# Patient Record
Sex: Female | Born: 1975 | Hispanic: No | Marital: Married
Health system: Southern US, Community
[De-identification: ages and names within clinical notes are randomized; demographics above are authoritative.]

## PROBLEM LIST (undated history)

## (undated) DIAGNOSIS — Z87442 Personal history of urinary calculi: Secondary | ICD-10-CM

## (undated) DIAGNOSIS — M1990 Unspecified osteoarthritis, unspecified site: Secondary | ICD-10-CM

## (undated) DIAGNOSIS — R7303 Prediabetes: Secondary | ICD-10-CM

## (undated) DIAGNOSIS — D649 Anemia, unspecified: Secondary | ICD-10-CM

## (undated) DIAGNOSIS — Z789 Other specified health status: Secondary | ICD-10-CM

## (undated) HISTORY — PX: NO PAST SURGERIES: SHX2092

---

## 2016-04-10 DIAGNOSIS — H5213 Myopia, bilateral: Secondary | ICD-10-CM | POA: Diagnosis not present

## 2016-08-02 DIAGNOSIS — Z23 Encounter for immunization: Secondary | ICD-10-CM | POA: Diagnosis not present

## 2016-08-30 DIAGNOSIS — Z1231 Encounter for screening mammogram for malignant neoplasm of breast: Secondary | ICD-10-CM | POA: Diagnosis not present

## 2016-08-30 DIAGNOSIS — Z6825 Body mass index (BMI) 25.0-25.9, adult: Secondary | ICD-10-CM | POA: Diagnosis not present

## 2016-08-30 DIAGNOSIS — Z1389 Encounter for screening for other disorder: Secondary | ICD-10-CM | POA: Diagnosis not present

## 2016-08-30 DIAGNOSIS — Z1151 Encounter for screening for human papillomavirus (HPV): Secondary | ICD-10-CM | POA: Diagnosis not present

## 2016-08-30 DIAGNOSIS — Z01419 Encounter for gynecological examination (general) (routine) without abnormal findings: Secondary | ICD-10-CM | POA: Diagnosis not present

## 2016-08-30 DIAGNOSIS — Z124 Encounter for screening for malignant neoplasm of cervix: Secondary | ICD-10-CM | POA: Diagnosis not present

## 2017-02-05 DIAGNOSIS — Z0000 Encounter for general adult medical examination without abnormal findings: Secondary | ICD-10-CM | POA: Diagnosis not present

## 2017-06-15 DIAGNOSIS — Z23 Encounter for immunization: Secondary | ICD-10-CM | POA: Diagnosis not present

## 2017-08-09 DIAGNOSIS — H5213 Myopia, bilateral: Secondary | ICD-10-CM | POA: Diagnosis not present

## 2017-10-02 DIAGNOSIS — B079 Viral wart, unspecified: Secondary | ICD-10-CM | POA: Diagnosis not present

## 2018-01-29 DIAGNOSIS — N393 Stress incontinence (female) (male): Secondary | ICD-10-CM | POA: Diagnosis not present

## 2018-01-29 DIAGNOSIS — Z0000 Encounter for general adult medical examination without abnormal findings: Secondary | ICD-10-CM | POA: Diagnosis not present

## 2018-01-29 DIAGNOSIS — Z6825 Body mass index (BMI) 25.0-25.9, adult: Secondary | ICD-10-CM | POA: Diagnosis not present

## 2018-01-29 DIAGNOSIS — R1312 Dysphagia, oropharyngeal phase: Secondary | ICD-10-CM | POA: Diagnosis not present

## 2018-01-29 DIAGNOSIS — Z1389 Encounter for screening for other disorder: Secondary | ICD-10-CM | POA: Diagnosis not present

## 2018-01-29 DIAGNOSIS — Z01419 Encounter for gynecological examination (general) (routine) without abnormal findings: Secondary | ICD-10-CM | POA: Diagnosis not present

## 2018-01-29 DIAGNOSIS — Z124 Encounter for screening for malignant neoplasm of cervix: Secondary | ICD-10-CM | POA: Diagnosis not present

## 2018-01-29 DIAGNOSIS — Z1231 Encounter for screening mammogram for malignant neoplasm of breast: Secondary | ICD-10-CM | POA: Diagnosis not present

## 2018-01-29 DIAGNOSIS — Z23 Encounter for immunization: Secondary | ICD-10-CM | POA: Diagnosis not present

## 2018-01-29 DIAGNOSIS — E611 Iron deficiency: Secondary | ICD-10-CM | POA: Diagnosis not present

## 2018-01-30 ENCOUNTER — Other Ambulatory Visit: Payer: Self-pay | Admitting: Family Medicine

## 2018-01-30 DIAGNOSIS — R1312 Dysphagia, oropharyngeal phase: Secondary | ICD-10-CM

## 2018-01-31 ENCOUNTER — Ambulatory Visit
Admission: RE | Admit: 2018-01-31 | Discharge: 2018-01-31 | Disposition: A | Discharge status: Home / Self Care | Payer: 59 | Source: Ambulatory Visit | Attending: Family Medicine | Admitting: Family Medicine

## 2018-01-31 DIAGNOSIS — R1310 Dysphagia, unspecified: Secondary | ICD-10-CM | POA: Diagnosis not present

## 2018-01-31 DIAGNOSIS — R1312 Dysphagia, oropharyngeal phase: Secondary | ICD-10-CM

## 2018-04-17 DIAGNOSIS — R05 Cough: Secondary | ICD-10-CM | POA: Diagnosis not present

## 2018-05-10 DIAGNOSIS — Z23 Encounter for immunization: Secondary | ICD-10-CM | POA: Diagnosis not present

## 2018-09-24 DIAGNOSIS — H5213 Myopia, bilateral: Secondary | ICD-10-CM | POA: Diagnosis not present

## 2019-04-29 DIAGNOSIS — Z13220 Encounter for screening for lipoid disorders: Secondary | ICD-10-CM | POA: Diagnosis not present

## 2019-04-29 DIAGNOSIS — Z0000 Encounter for general adult medical examination without abnormal findings: Secondary | ICD-10-CM | POA: Diagnosis not present

## 2019-04-29 DIAGNOSIS — E611 Iron deficiency: Secondary | ICD-10-CM | POA: Diagnosis not present

## 2019-04-29 DIAGNOSIS — Z131 Encounter for screening for diabetes mellitus: Secondary | ICD-10-CM | POA: Diagnosis not present

## 2019-04-30 DIAGNOSIS — Z1389 Encounter for screening for other disorder: Secondary | ICD-10-CM | POA: Diagnosis not present

## 2019-04-30 DIAGNOSIS — Z130 Encounter for screening for diseases of the blood and blood-forming organs and certain disorders involving the immune mechanism: Secondary | ICD-10-CM | POA: Diagnosis not present

## 2019-04-30 DIAGNOSIS — Z01419 Encounter for gynecological examination (general) (routine) without abnormal findings: Secondary | ICD-10-CM | POA: Diagnosis not present

## 2019-04-30 DIAGNOSIS — Z6826 Body mass index (BMI) 26.0-26.9, adult: Secondary | ICD-10-CM | POA: Diagnosis not present

## 2019-05-21 DIAGNOSIS — Z23 Encounter for immunization: Secondary | ICD-10-CM | POA: Diagnosis not present

## 2019-06-06 DIAGNOSIS — M545 Low back pain: Secondary | ICD-10-CM | POA: Diagnosis not present

## 2019-06-06 DIAGNOSIS — Z0000 Encounter for general adult medical examination without abnormal findings: Secondary | ICD-10-CM | POA: Diagnosis not present

## 2019-11-07 ENCOUNTER — Ambulatory Visit: Payer: 59 | Attending: Internal Medicine

## 2019-11-07 DIAGNOSIS — Z23 Encounter for immunization: Secondary | ICD-10-CM | POA: Insufficient documentation

## 2019-11-07 NOTE — Progress Notes (Signed)
   Covid-19 Vaccination Clinic  Name:  Alexandra Vargas    MRN: [id number] DOB: [birth year]  11/07/2019  Ms. Rod was observed post Covid-19 immunization for 15 minutes without incident. She was provided with Vaccine Information Sheet and instruction to access the V-Safe system.   [name] was instructed to call 911 with any severe reactions post vaccine: Marland Kitchen Difficulty breathing  . Swelling of face and throat  . A fast heartbeat  . A bad rash all over body  . Dizziness and weakness   Immunizations Administered    Name Date Dose VIS Date Route   Pfizer COVID-19 Vaccine 11/07/2019 10:00 AM 0.3 mL 08/15/2019 Intramuscular   Manufacturer: [employer], [employer]   Lot: [id number]   NDC: [address]-7

## 2019-12-03 ENCOUNTER — Ambulatory Visit: Payer: 59 | Attending: Internal Medicine

## 2019-12-03 DIAGNOSIS — Z23 Encounter for immunization: Secondary | ICD-10-CM

## 2019-12-03 NOTE — Progress Notes (Signed)
   Covid-19 Vaccination Clinic  Name:  Alexandra Vargas    MRN: [id number] DOB: 24-Jan-1976  12/03/2019  Ms. Alexandra Vargas was observed post Covid-19 immunization for 15 minutes without incident. She was provided with Vaccine Information Sheet and instruction to access the V-Safe system.   Ms. Alexandra Vargas was instructed to call 911 with any severe reactions post vaccine: Marland Kitchen Difficulty breathing  . Swelling of face and throat  . A fast heartbeat  . A bad rash all over body  . Dizziness and weakness   Immunizations Administered    Name Date Dose VIS Date Route   Pfizer COVID-19 Vaccine 12/03/2019 10:32 AM 0.3 mL 08/15/2019 Intramuscular   Manufacturer: [employer], [employer]   Lot: [id number]   NDC: [address]-3

## 2020-05-30 DIAGNOSIS — Z20822 Contact with and (suspected) exposure to covid-19: Secondary | ICD-10-CM | POA: Diagnosis not present

## 2020-05-30 DIAGNOSIS — U071 COVID-19: Secondary | ICD-10-CM | POA: Diagnosis not present

## 2020-07-02 DIAGNOSIS — Z0000 Encounter for general adult medical examination without abnormal findings: Secondary | ICD-10-CM | POA: Diagnosis not present

## 2020-07-02 DIAGNOSIS — Z131 Encounter for screening for diabetes mellitus: Secondary | ICD-10-CM | POA: Diagnosis not present

## 2020-07-02 DIAGNOSIS — Z13220 Encounter for screening for lipoid disorders: Secondary | ICD-10-CM | POA: Diagnosis not present

## 2020-08-13 DIAGNOSIS — Z6825 Body mass index (BMI) 25.0-25.9, adult: Secondary | ICD-10-CM | POA: Diagnosis not present

## 2020-08-13 DIAGNOSIS — Z1231 Encounter for screening mammogram for malignant neoplasm of breast: Secondary | ICD-10-CM | POA: Diagnosis not present

## 2020-08-13 DIAGNOSIS — Z780 Asymptomatic menopausal state: Secondary | ICD-10-CM | POA: Diagnosis not present

## 2020-08-13 DIAGNOSIS — Z1389 Encounter for screening for other disorder: Secondary | ICD-10-CM | POA: Diagnosis not present

## 2020-08-13 DIAGNOSIS — Z01419 Encounter for gynecological examination (general) (routine) without abnormal findings: Secondary | ICD-10-CM | POA: Diagnosis not present

## 2020-08-16 DIAGNOSIS — Z1211 Encounter for screening for malignant neoplasm of colon: Secondary | ICD-10-CM | POA: Diagnosis not present

## 2020-08-16 DIAGNOSIS — E611 Iron deficiency: Secondary | ICD-10-CM | POA: Diagnosis not present

## 2020-11-11 DIAGNOSIS — Z01812 Encounter for preprocedural laboratory examination: Secondary | ICD-10-CM | POA: Diagnosis not present

## 2020-11-16 DIAGNOSIS — Z1211 Encounter for screening for malignant neoplasm of colon: Secondary | ICD-10-CM | POA: Diagnosis not present

## 2021-04-09 DIAGNOSIS — H5213 Myopia, bilateral: Secondary | ICD-10-CM | POA: Diagnosis not present

## 2021-07-06 DIAGNOSIS — Z23 Encounter for immunization: Secondary | ICD-10-CM | POA: Diagnosis not present

## 2021-07-06 DIAGNOSIS — Z0000 Encounter for general adult medical examination without abnormal findings: Secondary | ICD-10-CM | POA: Diagnosis not present

## 2021-07-06 DIAGNOSIS — Z131 Encounter for screening for diabetes mellitus: Secondary | ICD-10-CM | POA: Diagnosis not present

## 2021-07-06 DIAGNOSIS — D509 Iron deficiency anemia, unspecified: Secondary | ICD-10-CM | POA: Diagnosis not present

## 2021-07-06 DIAGNOSIS — Z13220 Encounter for screening for lipoid disorders: Secondary | ICD-10-CM | POA: Diagnosis not present

## 2021-07-11 ENCOUNTER — Other Ambulatory Visit (HOSPITAL_COMMUNITY): Payer: Self-pay

## 2021-07-11 MED ORDER — FERROUS SULFATE 325 (65 FE) MG PO TABS
325.0000 mg | ORAL_TABLET | Freq: Every day | ORAL | 6 refills | Status: DC
  Filled 2021-07-11: qty 30, 30d supply, fill #0

## 2021-07-15 ENCOUNTER — Other Ambulatory Visit (HOSPITAL_COMMUNITY): Payer: Self-pay | Admitting: Family Medicine

## 2021-08-05 ENCOUNTER — Other Ambulatory Visit (HOSPITAL_COMMUNITY): Payer: Self-pay

## 2021-08-16 DIAGNOSIS — Z780 Asymptomatic menopausal state: Secondary | ICD-10-CM | POA: Diagnosis not present

## 2021-08-16 DIAGNOSIS — Z6825 Body mass index (BMI) 25.0-25.9, adult: Secondary | ICD-10-CM | POA: Diagnosis not present

## 2021-08-16 DIAGNOSIS — Z01419 Encounter for gynecological examination (general) (routine) without abnormal findings: Secondary | ICD-10-CM | POA: Diagnosis not present

## 2021-08-16 DIAGNOSIS — Z1231 Encounter for screening mammogram for malignant neoplasm of breast: Secondary | ICD-10-CM | POA: Diagnosis not present

## 2021-08-16 DIAGNOSIS — Z1151 Encounter for screening for human papillomavirus (HPV): Secondary | ICD-10-CM | POA: Diagnosis not present

## 2021-08-16 DIAGNOSIS — Z1389 Encounter for screening for other disorder: Secondary | ICD-10-CM | POA: Diagnosis not present

## 2021-08-16 DIAGNOSIS — Z124 Encounter for screening for malignant neoplasm of cervix: Secondary | ICD-10-CM | POA: Diagnosis not present

## 2021-08-19 ENCOUNTER — Ambulatory Visit (HOSPITAL_BASED_OUTPATIENT_CLINIC_OR_DEPARTMENT_OTHER)
Admission: RE | Admit: 2021-08-19 | Discharge: 2021-08-19 | Disposition: A | Discharge status: Home / Self Care | Payer: 59 | Source: Ambulatory Visit | Attending: Family Medicine | Admitting: Family Medicine

## 2021-08-19 ENCOUNTER — Other Ambulatory Visit: Payer: Self-pay

## 2021-08-19 DIAGNOSIS — N200 Calculus of kidney: Secondary | ICD-10-CM | POA: Insufficient documentation

## 2021-08-19 DIAGNOSIS — Z136 Encounter for screening for cardiovascular disorders: Secondary | ICD-10-CM | POA: Insufficient documentation

## 2021-08-19 DIAGNOSIS — E7800 Pure hypercholesterolemia, unspecified: Secondary | ICD-10-CM | POA: Insufficient documentation

## 2022-03-25 IMAGING — CT CT CARDIAC CORONARY ARTERY CALCIUM SCORE
3 series · 14 of 20 positions shown, 16 images · non-contrast
Comparison: None.
COMPARISON: None.

Addendum:
EXAM:
OVER-READ INTERPRETATION  CT CHEST

The following report is an over-read performed by radiologist Dr.
over-read does not include interpretation of cardiac or coronary
anatomy or pathology. The calcium score interpretation by the
cardiologist is attached.
CLINICAL DATA: Cardiovascular Disease Risk stratification
Coronary Calcium Score
TECHNIQUE: A gated, non-contrast computed tomography scan of the heart was
performed using 3mm slice thickness. Axial images were analyzed on a
dedicated workstation. Calcium scoring of the coronary arteries was
performed using the Agatston method.

[Series 2: ax lung · axial · 0.78mm/px · z∈[-168,-56]mm · 5 of 86 slices shown]
[im 15/86  lung]
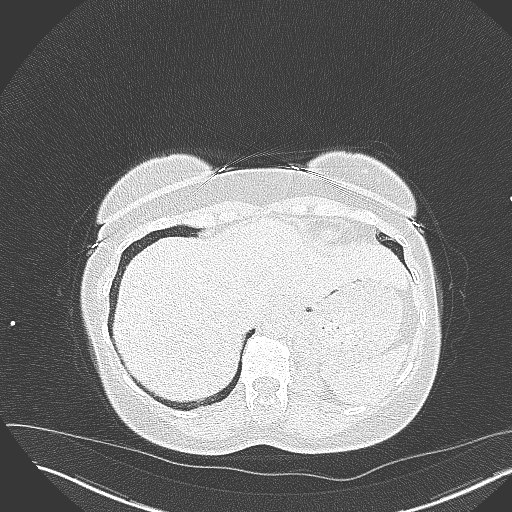
[im 29/86  lung]
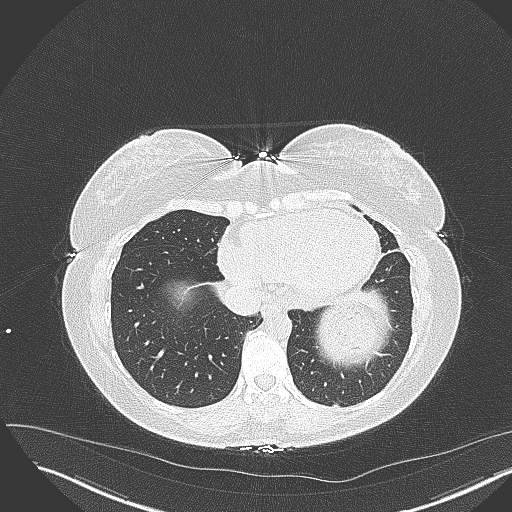
[im 43/86  lung]
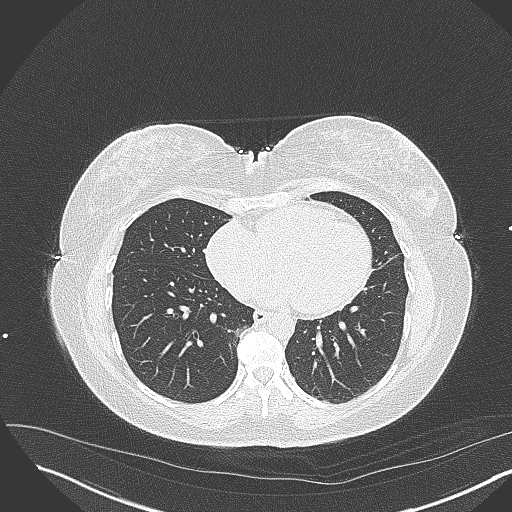
[im 57/86  lung]
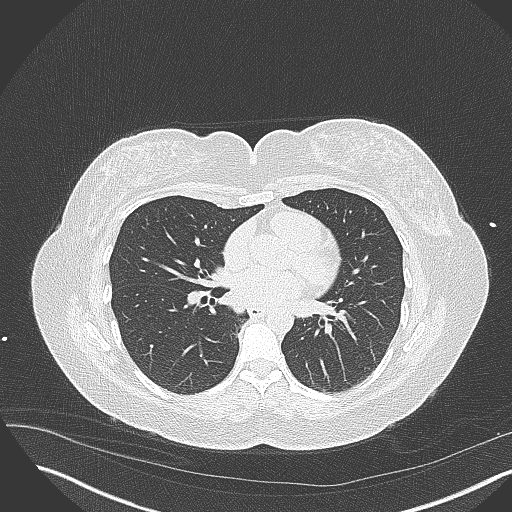
[im 71/86  lung]
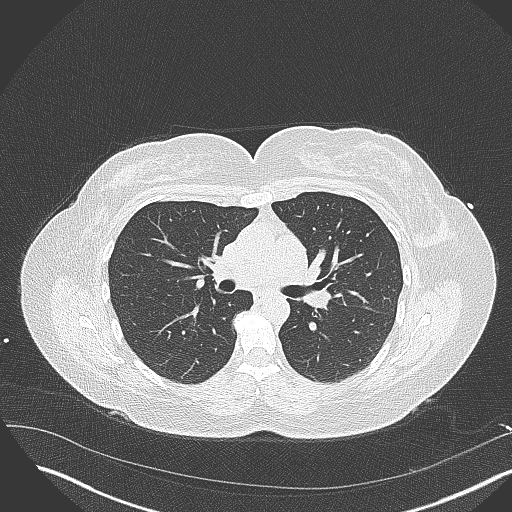

[Series 3: cascseq 3.0 sa36 70% (id) · axial · 0.36mm/px · z∈[-153,-69]mm · 3 of 57 slices shown]
[im 15/57  vessel]
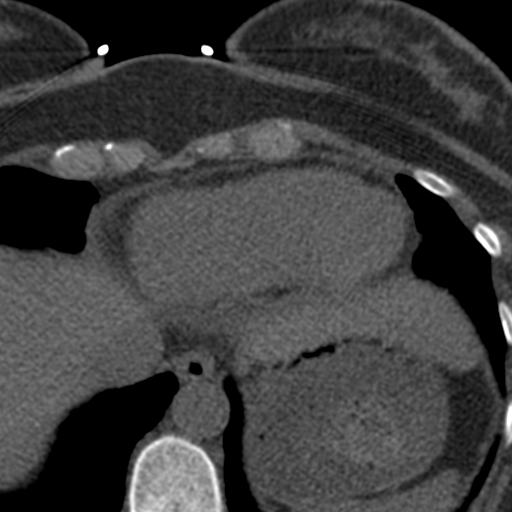
[im 29/57  vessel]
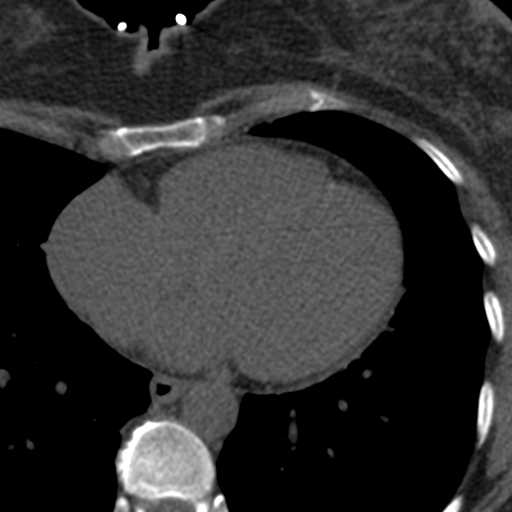
[im 43/57  vessel]
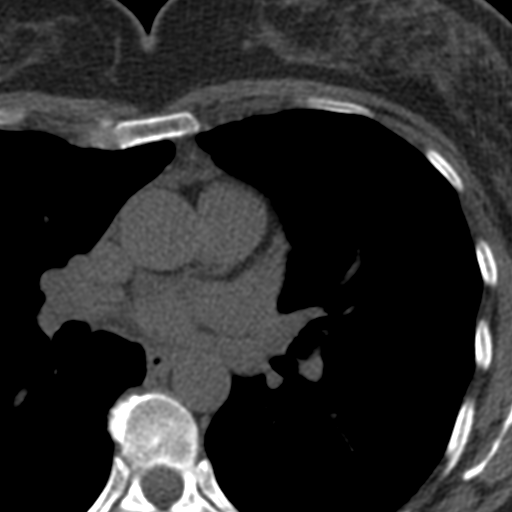

[Series 4: ax st · axial · 0.63mm/px · z∈[-172,-52]mm · 6 of 86 slices shown, 8 images]
[im 13/86  vessel]
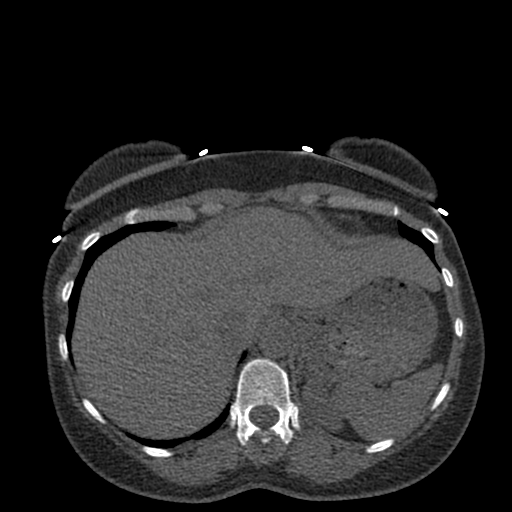
[im 13/86  lung]
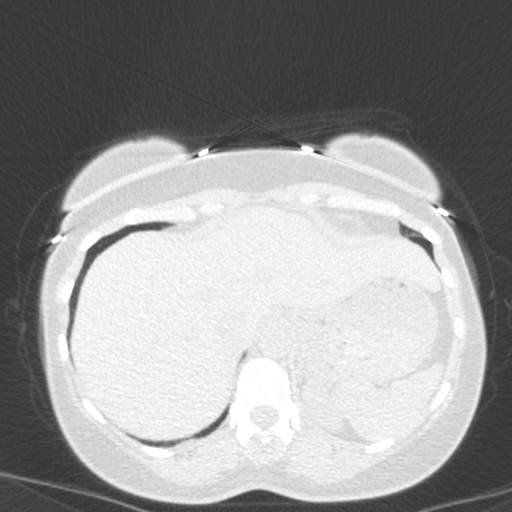
[im 25/86  vessel]
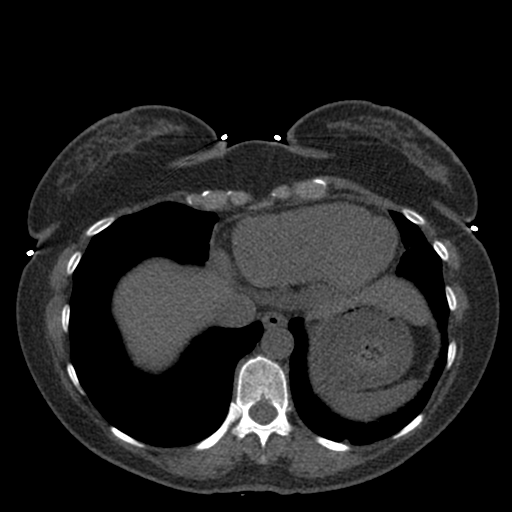
[im 37/86  vessel]
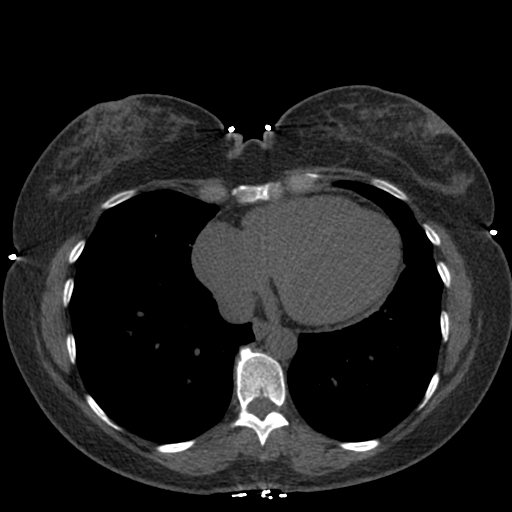
[im 49/86  vessel]
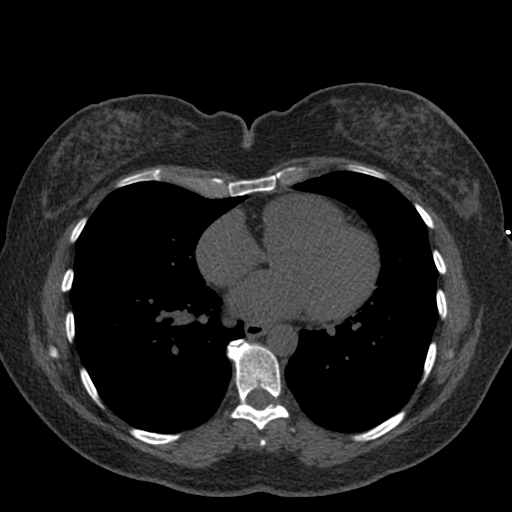
[im 61/86  vessel]
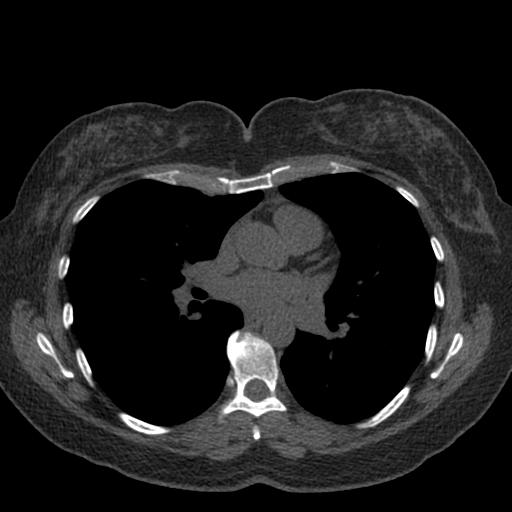
[im 61/86  lung]
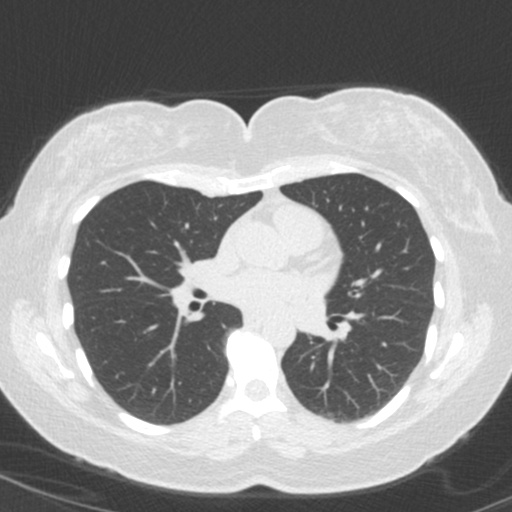
[im 73/86  vessel]
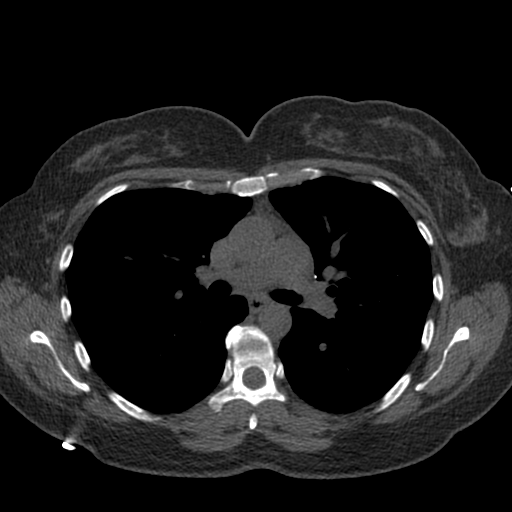

[14 of 20 positions shown; findings below may reference images not displayed]

FINDINGS: Vascular: Normal aortic caliber.

Mediastinum/Nodes: No imaged thoracic adenopathy.

Lungs/Pleura: No pleural fluid.  Clear imaged lungs.

Upper Abdomen: Normal imaged portions of the liver, spleen, stomach,
left adrenal gland. 4 mm upper pole left renal collecting system
calculus.

Musculoskeletal: No acute osseous abnormality.
IMPRESSION: 1.  No acute findings in the imaged extracardiac chest.
2. Left nephrolithiasis.
FINDINGS: Coronary arteries: Normal origins.

Coronary Calcium Score:

Total: 0

Percentile: 0

Pericardium: Normal.

Ascending Aorta: Normal caliber.

Non-cardiac: See separate report from [REDACTED].
IMPRESSION: Coronary calcium score of 0. This was 0 percentile for age-, race-,
and sex-matched controls.



If CAC=0, it is reasonable to withhold statin therapy and reassess
in 5 to 10 years, as long as higher risk conditions are absent
(diabetes mellitus, family history of premature CHD in first degree
relatives (males <55 years; females <65 years), cigarette smoking,
or LDL >=190 mg/dL).

If CAC is 1 to 99, it is reasonable to initiate statin therapy for
patients >=55 years of age.

If CAC is >=100 or >=75th percentile, it is reasonable to initiate
statin therapy at any age.

Cardiology referral should be considered for patients with CAC
scores >=400 or >=75th percentile.

*9116 AHA/ACC/AACVPR/AAPA/ABC/PAULUS N/TOCHA/YAYAMA/Edi/VANBUREN/CARLSON/FRANDYJN
Guideline on the Management of Blood Cholesterol: A Report of the
American College of Cardiology/American Heart Association Task Force
on Clinical Practice Guidelines. J Am Coll Cardiol.
5496;73(24):1616-1197.

*** End of Addendum ***
EXAM:
OVER-READ INTERPRETATION  CT CHEST

The following report is an over-read performed by radiologist Dr.
over-read does not include interpretation of cardiac or coronary
anatomy or pathology. The calcium score interpretation by the
cardiologist is attached.
FINDINGS: Vascular: Normal aortic caliber.

Mediastinum/Nodes: No imaged thoracic adenopathy.

Lungs/Pleura: No pleural fluid.  Clear imaged lungs.

Upper Abdomen: Normal imaged portions of the liver, spleen, stomach,
left adrenal gland. 4 mm upper pole left renal collecting system
calculus.

Musculoskeletal: No acute osseous abnormality.
IMPRESSION: 1.  No acute findings in the imaged extracardiac chest.
2. Left nephrolithiasis.

## 2022-07-24 DIAGNOSIS — Z0000 Encounter for general adult medical examination without abnormal findings: Secondary | ICD-10-CM | POA: Diagnosis not present

## 2022-07-24 DIAGNOSIS — R7303 Prediabetes: Secondary | ICD-10-CM | POA: Diagnosis not present

## 2022-07-24 DIAGNOSIS — E7800 Pure hypercholesterolemia, unspecified: Secondary | ICD-10-CM | POA: Diagnosis not present

## 2022-07-24 DIAGNOSIS — Z23 Encounter for immunization: Secondary | ICD-10-CM | POA: Diagnosis not present

## 2022-12-07 DIAGNOSIS — H524 Presbyopia: Secondary | ICD-10-CM | POA: Diagnosis not present

## 2022-12-07 DIAGNOSIS — H5213 Myopia, bilateral: Secondary | ICD-10-CM | POA: Diagnosis not present

## 2023-06-05 DIAGNOSIS — Z23 Encounter for immunization: Secondary | ICD-10-CM | POA: Diagnosis not present

## 2023-07-30 DIAGNOSIS — Z0000 Encounter for general adult medical examination without abnormal findings: Secondary | ICD-10-CM | POA: Diagnosis not present

## 2023-07-30 DIAGNOSIS — E7800 Pure hypercholesterolemia, unspecified: Secondary | ICD-10-CM | POA: Diagnosis not present

## 2023-07-30 DIAGNOSIS — Z131 Encounter for screening for diabetes mellitus: Secondary | ICD-10-CM | POA: Diagnosis not present

## 2023-07-30 DIAGNOSIS — D509 Iron deficiency anemia, unspecified: Secondary | ICD-10-CM | POA: Diagnosis not present

## 2023-12-13 DIAGNOSIS — Z01419 Encounter for gynecological examination (general) (routine) without abnormal findings: Secondary | ICD-10-CM | POA: Diagnosis not present

## 2023-12-13 DIAGNOSIS — Z1231 Encounter for screening mammogram for malignant neoplasm of breast: Secondary | ICD-10-CM | POA: Diagnosis not present

## 2023-12-13 DIAGNOSIS — Z1389 Encounter for screening for other disorder: Secondary | ICD-10-CM | POA: Diagnosis not present

## 2023-12-13 DIAGNOSIS — Z130 Encounter for screening for diseases of the blood and blood-forming organs and certain disorders involving the immune mechanism: Secondary | ICD-10-CM | POA: Diagnosis not present

## 2023-12-19 ENCOUNTER — Other Ambulatory Visit (HOSPITAL_COMMUNITY): Payer: Self-pay

## 2023-12-19 ENCOUNTER — Ambulatory Visit
Admission: RE | Admit: 2023-12-19 | Discharge: 2023-12-19 | Disposition: A | Discharge status: Home / Self Care | Payer: Self-pay | Source: Ambulatory Visit | Attending: Family Medicine | Admitting: Family Medicine

## 2023-12-19 ENCOUNTER — Other Ambulatory Visit: Payer: Self-pay | Admitting: Family Medicine

## 2023-12-19 DIAGNOSIS — R1012 Left upper quadrant pain: Secondary | ICD-10-CM

## 2023-12-19 DIAGNOSIS — R109 Unspecified abdominal pain: Secondary | ICD-10-CM | POA: Diagnosis not present

## 2023-12-19 DIAGNOSIS — N201 Calculus of ureter: Secondary | ICD-10-CM | POA: Diagnosis not present

## 2023-12-19 DIAGNOSIS — N1330 Unspecified hydronephrosis: Secondary | ICD-10-CM | POA: Diagnosis not present

## 2023-12-19 DIAGNOSIS — N132 Hydronephrosis with renal and ureteral calculous obstruction: Secondary | ICD-10-CM | POA: Diagnosis not present

## 2023-12-19 DIAGNOSIS — R3129 Other microscopic hematuria: Secondary | ICD-10-CM | POA: Diagnosis not present

## 2023-12-19 DIAGNOSIS — R110 Nausea: Secondary | ICD-10-CM | POA: Diagnosis not present

## 2023-12-19 MED ORDER — TAMSULOSIN HCL 0.4 MG PO CAPS
0.4000 mg | ORAL_CAPSULE | Freq: Every day | ORAL | 0 refills | Status: DC
  Filled 2023-12-19: qty 30, 30d supply, fill #0

## 2023-12-20 ENCOUNTER — Other Ambulatory Visit: Payer: Self-pay

## 2023-12-20 ENCOUNTER — Other Ambulatory Visit (HOSPITAL_COMMUNITY): Payer: Self-pay

## 2023-12-20 DIAGNOSIS — R8271 Bacteriuria: Secondary | ICD-10-CM | POA: Diagnosis not present

## 2023-12-20 DIAGNOSIS — N201 Calculus of ureter: Secondary | ICD-10-CM | POA: Diagnosis not present

## 2024-01-03 ENCOUNTER — Other Ambulatory Visit (HOSPITAL_COMMUNITY): Payer: Self-pay

## 2024-01-03 DIAGNOSIS — N201 Calculus of ureter: Secondary | ICD-10-CM | POA: Diagnosis not present

## 2024-01-03 MED ORDER — OXYCODONE-ACETAMINOPHEN 5-325 MG PO TABS
1.0000 | ORAL_TABLET | Freq: Four times a day (QID) | ORAL | 0 refills | Status: DC | PRN
  Filled 2024-01-03: qty 15, 4d supply, fill #0

## 2024-01-15 ENCOUNTER — Other Ambulatory Visit (HOSPITAL_COMMUNITY): Payer: Self-pay

## 2024-01-18 ENCOUNTER — Other Ambulatory Visit (HOSPITAL_COMMUNITY): Payer: Self-pay

## 2024-01-18 ENCOUNTER — Encounter (HOSPITAL_COMMUNITY): Payer: Self-pay | Admitting: Urology

## 2024-01-18 ENCOUNTER — Other Ambulatory Visit: Payer: Self-pay | Admitting: Urology

## 2024-01-18 DIAGNOSIS — N201 Calculus of ureter: Secondary | ICD-10-CM | POA: Diagnosis not present

## 2024-01-18 MED ORDER — TAMSULOSIN HCL 0.4 MG PO CAPS
0.4000 mg | ORAL_CAPSULE | Freq: Every day | ORAL | 0 refills | Status: DC
  Filled 2024-01-18: qty 30, 30d supply, fill #0

## 2024-01-21 ENCOUNTER — Ambulatory Visit (HOSPITAL_COMMUNITY)

## 2024-01-21 ENCOUNTER — Other Ambulatory Visit (HOSPITAL_COMMUNITY): Payer: Self-pay

## 2024-01-21 ENCOUNTER — Other Ambulatory Visit: Payer: Self-pay

## 2024-01-21 ENCOUNTER — Encounter (HOSPITAL_COMMUNITY)
Admission: RE | Disposition: A | Discharge status: Home / Self Care | Payer: Self-pay | Source: Home / Self Care | Attending: Urology

## 2024-01-21 ENCOUNTER — Encounter (HOSPITAL_COMMUNITY): Payer: Self-pay | Admitting: Urology

## 2024-01-21 ENCOUNTER — Ambulatory Visit (HOSPITAL_COMMUNITY)
Admission: RE | Admit: 2024-01-21 | Discharge: 2024-01-21 | Disposition: A | Discharge status: Home / Self Care | Attending: Urology | Admitting: Urology

## 2024-01-21 DIAGNOSIS — N201 Calculus of ureter: Secondary | ICD-10-CM | POA: Diagnosis not present

## 2024-01-21 DIAGNOSIS — N200 Calculus of kidney: Secondary | ICD-10-CM

## 2024-01-21 HISTORY — PX: EXTRACORPOREAL SHOCK WAVE LITHOTRIPSY: SHX1557

## 2024-01-21 HISTORY — DX: Personal history of urinary calculi: Z87.442

## 2024-01-21 LAB — POCT PREGNANCY, URINE: Preg Test, Ur: NEGATIVE

## 2024-01-21 SURGERY — LITHOTRIPSY, ESWL
Anesthesia: LOCAL | Laterality: Left

## 2024-01-21 MED ORDER — DIPHENHYDRAMINE HCL 25 MG PO CAPS
25.0000 mg | ORAL_CAPSULE | ORAL | Status: AC
  Administered 2024-01-21: 25 mg via ORAL
  Filled 2024-01-21: qty 1

## 2024-01-21 MED ORDER — DIAZEPAM 5 MG PO TABS
10.0000 mg | ORAL_TABLET | ORAL | Status: AC
  Administered 2024-01-21: 10 mg via ORAL
  Filled 2024-01-21: qty 2

## 2024-01-21 MED ORDER — CIPROFLOXACIN HCL 500 MG PO TABS
500.0000 mg | ORAL_TABLET | ORAL | Status: AC
  Administered 2024-01-21: 500 mg via ORAL
  Filled 2024-01-21: qty 1

## 2024-01-21 MED ORDER — SODIUM CHLORIDE 0.9 % IV SOLN: INTRAVENOUS | Status: DC

## 2024-01-21 MED ORDER — TRAMADOL HCL 50 MG PO TABS
50.0000 mg | ORAL_TABLET | Freq: Four times a day (QID) | ORAL | 0 refills | Status: AC | PRN
Start: 2024-01-21 — End: 2024-01-24
  Filled 2024-01-21: qty 12, 3d supply, fill #0

## 2024-01-21 NOTE — Op Note (Signed)
 See [employer] operative note scanned into chart. Also because of the size, density, location and other factors that cannot be anticipated I feel this will likely be a staged procedure. This fact supersedes any indication in the scanned [place] [facility] operative note to the contrary.  Julene Oaks MD 01/21/2024, 9:36 AM  [facility]  Pager: [phone]

## 2024-01-21 NOTE — H&P (Signed)
 H&P  History of Present Illness: Fraida Veldman is a 48 y.o. year old F who presents today for treatment of a left distal ureteral stone  No acute complaints  Past Medical History:  Diagnosis Date   History of kidney stones     History reviewed. No pertinent surgical history.  Home Medications:  Current Meds  Medication Sig   tamsulosin  (FLOMAX ) 0.4 MG CAPS capsule Take 1 capsule (0.4 mg total) by mouth daily.   tamsulosin  (FLOMAX ) 0.4 MG CAPS capsule Take 1 capsule (0.4 mg total) by mouth daily.    Allergies: No Known Allergies  History reviewed. No pertinent family history.  Social History:  reports that she has never smoked. She has never used smokeless tobacco. She reports current alcohol use of about 1.0 standard drink of alcohol per week. She reports that she does not use drugs.  ROS: A complete review of systems was performed.  All systems are negative except for pertinent findings as noted.  Physical Exam:  Vital signs in last 24 hours: Temp:  [98 F (36.7 C)] 98 F (36.7 C) (05/19 0713) Pulse Rate:  [79] 79 (05/19 0713) Resp:  [14] 14 (05/19 0713) BP: (112)/(77) 112/77 (05/19 0713) Weight:  [67.4 kg] 67.4 kg (05/19 0713) Constitutional:  Alert and oriented, No acute distress Cardiovascular: Regular rate and rhythm Respiratory: Normal respiratory effort, Lungs clear bilaterally GI: Abdomen is soft, nontender, nondistended, no abdominal masses Lymphatic: No lymphadenopathy Neurologic: Grossly intact, no focal deficits Psychiatric: Normal mood and affect   Laboratory Data:  No results for input(s): "WBC", "HGB", "HCT", "PLT" in the last 72 hours.  No results for input(s): "NA", "K", "CL", "GLUCOSE", "BUN", "CALCIUM", "CREATININE" in the last 72 hours.  Invalid input(s): "CO3"   Results for orders placed or performed during the hospital encounter of 01/21/24 (from the past 24 hours)  Pregnancy, urine POC     Status: None   Collection Time: 01/21/24  7:04 AM   Result Value Ref Range   Preg Test, Ur NEGATIVE NEGATIVE   No results found for this or any previous visit (from the past 240 hours).  Renal Function: No results for input(s): "CREATININE" in the last 168 hours. CrCl cannot be calculated (No successful lab value found.).  Radiologic Imaging: No results found.  Assessment:  Ema Hebner is a 48 y.o. year old F with left distal stone   Plan:  --to OR as planned for L ESWL. Procedure and risks reviewed, including but not limited to hematuria, infection, sepsis, damage to GU tract, failure to complete procedure, retained stone fragments, need for future procedures, steinstrasse, pain, failure to pass fragments  Julene Oaks, MD 01/21/2024, 8:12 AM  [facility] Pager: [phone]

## 2024-01-21 NOTE — Discharge Instructions (Signed)
1. You should strain your urine and collect all fragments and bring them to your follow up appointment.  °2. You should take your pain medication as needed.  Please call if your pain is severe to the point that it is not controlled with your pain medication. °3. You should call if you develop fever > 101 or persistent nausea or vomiting. °4. Your doctor may prescribe tamsulosin to take to help facilitate stone passage. °

## 2024-01-22 ENCOUNTER — Encounter (HOSPITAL_COMMUNITY): Payer: Self-pay | Admitting: Urology

## 2024-02-12 ENCOUNTER — Other Ambulatory Visit (HOSPITAL_COMMUNITY): Payer: Self-pay

## 2024-02-12 DIAGNOSIS — N201 Calculus of ureter: Secondary | ICD-10-CM | POA: Diagnosis not present

## 2024-02-12 MED ORDER — TAMSULOSIN HCL 0.4 MG PO CAPS
0.4000 mg | ORAL_CAPSULE | Freq: Every day | ORAL | 3 refills | Status: AC
  Filled 2024-02-12: qty 30, 30d supply, fill #0

## 2024-04-07 ENCOUNTER — Other Ambulatory Visit (HOSPITAL_COMMUNITY): Payer: Self-pay

## 2024-04-07 DIAGNOSIS — N201 Calculus of ureter: Secondary | ICD-10-CM | POA: Diagnosis not present

## 2024-04-07 MED ORDER — CEFADROXIL 500 MG PO CAPS
500.0000 mg | ORAL_CAPSULE | Freq: Two times a day (BID) | ORAL | 0 refills | Status: DC
  Filled 2024-04-07: qty 14, 7d supply, fill #0

## 2024-04-08 ENCOUNTER — Other Ambulatory Visit: Payer: Self-pay | Admitting: Urology

## 2024-04-10 DIAGNOSIS — N201 Calculus of ureter: Secondary | ICD-10-CM | POA: Diagnosis not present

## 2024-04-10 DIAGNOSIS — K5900 Constipation, unspecified: Secondary | ICD-10-CM | POA: Diagnosis not present

## 2024-04-10 DIAGNOSIS — Q438 Other specified congenital malformations of intestine: Secondary | ICD-10-CM | POA: Diagnosis not present

## 2024-04-16 NOTE — Progress Notes (Addendum)
 COVID Vaccine received:  []  No [x]  Yes Date of any COVID positive Test in last 90 days: none  PCP - Dibas Koirala, MD [phone]  Cardiologist - none  Chest x-ray -  EKG -  do at PST 04-18-24 Stress Test -  ECHO -  Cardiac Cath -  CT Coronary Calcium score: 0 on 08-19-2021  Epic  Bowel Prep - [x]  No  []   Yes ______  Pacemaker / ICD device [x]  No []  Yes   Spinal Cord Stimulator:[x]  No []  Yes       History of Sleep Apnea? [x]  No []  Yes   CPAP used?- [x]  No []  Yes    Patient has: []  NO Hx DM   [x]  Pre-DM   []  DM1  []   DM2 Does the patient monitor blood sugar?   []  N/A   [x]  No []  Yes  Last A1c was: 6.0 on  07-31-2024   No Meds  Blood Thinner / Instructions:  none Aspirin Instructions:  none  ERAS Protocol Ordered: [x]  No  []  Yes Patient is to be NPO after:   Dental hx: []  Dentures:  [x]  N/A      []  Bridge or Partial:                   []  Loose or Damaged teeth:   Comments: Dr Arna Shilling's wife. He is a Public relations account executive.   Activity level: Able to walk up 2 flights of stairs without becoming significantly short of breath or having chest pain?  []  No   [x]    Yes  Patient can perform ADLs without assistance. []  No   [x]   Yes  Anesthesia review: Pre-DM, Anemia  Patient denies any S&S of respiratory illness or Covid - no shortness of breath, fever, cough or chest pain at PAT appointment.  Patient verbalized understanding and agreement to the Pre-Surgical Instructions that were given to them at this PAT appointment. Patient was also educated of the need to review these PAT instructions again prior to her surgery.I reviewed the appropriate phone numbers to call if they have any and questions or concerns.

## 2024-04-17 DIAGNOSIS — H5213 Myopia, bilateral: Secondary | ICD-10-CM | POA: Diagnosis not present

## 2024-04-17 NOTE — Patient Instructions (Addendum)
 SURGICAL WAITING ROOM VISITATION Patients having surgery or a procedure may have no more than 2 support people in the waiting area - these visitors may rotate in the visitor waiting room.   If the patient needs to stay at the hospital during part of their recovery, the visitor guidelines for inpatient rooms apply.  PRE-OP VISITATION  Pre-op nurse will coordinate an appropriate time for 1 support person to accompany the patient in pre-op.  This support person may not rotate.  This visitor will be contacted when the time is appropriate for the visitor to come back in the pre-op area.  Please refer to the [facility] website for the visitor guidelines for Inpatients (after your surgery is over and you are in a regular room).  You are not required to quarantine at this time prior to your surgery. However, you must do this: Hand Hygiene often Do NOT share personal items Notify your provider if you are in close contact with someone who has COVID or you develop fever 100.4 or greater, new onset of sneezing, cough, sore throat, shortness of breath or body aches.  If you test positive for Covid or have been in contact with anyone that has tested positive in the last 10 days please notify you surgeon.    Your procedure is scheduled on:  Wednesday  April 30, 2024  Report to [facility] Main Entrance: [address] entrance where the [employer] is available.   Report to admitting at:  09:45  AM  Call this number if you have any questions or problems the morning of surgery [phone]  DO NOT EAT OR DRINK ANYTHING AFTER MIDNIGHT THE NIGHT PRIOR TO YOUR SURGERY / PROCEDURE.   FOLLOW  ANY ADDITIONAL PRE OP INSTRUCTIONS YOU RECEIVED FROM YOUR SURGEON'S OFFICE!!!   Oral Hygiene is also important to reduce your risk of infection.        Remember - BRUSH YOUR TEETH THE MORNING OF SURGERY WITH YOUR REGULAR TOOTHPASTE  Do NOT smoke after Midnight the night before surgery.  STOP TAKING all  Vitamins, Herbs and supplements 1 week before your surgery.   Take ONLY these medicines the morning of surgery with A SIP OF WATER: None                   You may not have any metal on your body including hair pins, jewelry, and body piercing  Do not wear make-up, lotions, powders, perfumes  or deodorant  Do not wear nail polish including gel and S&S, artificial / acrylic nails, or any other type of covering on natural nails including finger and toenails. If you have artificial nails, gel coating, etc., that needs to be removed by a nail salon, Please have this removed prior to surgery. Not doing so may mean that your surgery could be cancelled or delayed if the Surgeon or anesthesia staff feels like they are unable to monitor you safely.   Do not shave 48 hours prior to surgery to avoid nicks in your skin which may contribute to postoperative infections.   Contacts, Hearing Aids, dentures or bridgework may not be worn into surgery. DENTURES WILL BE REMOVED PRIOR TO SURGERY PLEASE DO NOT APPLY Poly grip OR ADHESIVES!!!  Patients discharged on the day of surgery will not be allowed to drive home.  Someone NEEDS to stay with you for the first 24 hours after anesthesia.  Do not bring your home medications to the hospital. The Pharmacy will dispense medications listed on  your medication list to you during your admission in the Hospital.  Special Instructions: Bring a copy of your healthcare power of attorney and living will documents the day of surgery, if you wish to have them scanned into your [facility] Records- EPIC  Please read over the following fact sheets you were given: IF YOU HAVE QUESTIONS ABOUT YOUR PRE-OP INSTRUCTIONS, PLEASE CALL [phone].   [place] - Preparing for Surgery Before surgery, you can play an important role.  Because skin is not sterile, your skin needs to be as free of germs as possible.  You can reduce the number of germs on your skin by washing with  CHG (chlorahexidine gluconate) soap before surgery.  CHG is an antiseptic cleaner which kills germs and bonds with the skin to continue killing germs even after washing. Please DO NOT use if you have an allergy to CHG or antibacterial soaps.  If your skin becomes reddened/irritated stop using the CHG and inform your nurse when you arrive at Short Stay. Do not shave (including legs and underarms) for at least 48 hours prior to the first CHG shower.  You may shave your face/neck.  Please follow these instructions carefully:  1.  Shower with CHG Soap the night before surgery and the  morning of surgery.  2.  If you choose to wash your hair, wash your hair first as usual with your normal  shampoo.  3.  After you shampoo, rinse your hair and body thoroughly to remove the shampoo.                             4.  Use CHG as you would any other liquid soap.  You can apply chg directly to the skin and wash.  Gently with a scrungie or clean washcloth.  5.  Apply the CHG Soap to your body ONLY FROM THE NECK DOWN.   Do not use on face/ open                           Wound or open sores. Avoid contact with eyes, ears mouth and genitals (private parts).                       Wash face,  Genitals (private parts) with your normal soap.             6.  Wash thoroughly, paying special attention to the area where your  surgery  will be performed.  7.  Thoroughly rinse your body with warm water from the neck down.  8.  DO NOT shower/wash with your normal soap after using and rinsing off the CHG Soap.            9.  Pat yourself dry with a clean towel.            10.  Wear clean pajamas.            11.  Place clean sheets on your bed the night of your first shower and do not  sleep with pets.  ON THE DAY OF SURGERY : Do not apply any lotions/deodorants the morning of surgery.  Please wear clean clothes to the hospital/surgery center.    FAILURE TO FOLLOW THESE INSTRUCTIONS MAY RESULT IN THE CANCELLATION OF YOUR  SURGERY  PATIENT SIGNATURE_________________________________  NURSE SIGNATURE__________________________________  ________________________________________________________________________

## 2024-04-18 ENCOUNTER — Other Ambulatory Visit: Payer: Self-pay

## 2024-04-18 ENCOUNTER — Encounter (HOSPITAL_COMMUNITY): Payer: Self-pay | Admitting: Medical

## 2024-04-18 ENCOUNTER — Encounter (HOSPITAL_COMMUNITY)
Admission: RE | Admit: 2024-04-18 | Discharge: 2024-04-18 | Disposition: A | Discharge status: Home / Self Care | Source: Ambulatory Visit | Attending: Urology | Admitting: Urology

## 2024-04-18 ENCOUNTER — Encounter (HOSPITAL_COMMUNITY): Payer: Self-pay

## 2024-04-18 VITALS — BP 114/72 | HR 72 | Temp 97.9°F | Resp 14 | Ht 65.0 in | Wt 150.0 lb

## 2024-04-18 DIAGNOSIS — Z01818 Encounter for other preprocedural examination: Secondary | ICD-10-CM | POA: Insufficient documentation

## 2024-04-18 DIAGNOSIS — R7303 Prediabetes: Secondary | ICD-10-CM | POA: Insufficient documentation

## 2024-04-18 DIAGNOSIS — D649 Anemia, unspecified: Secondary | ICD-10-CM | POA: Diagnosis not present

## 2024-04-18 HISTORY — DX: Anemia, unspecified: D64.9

## 2024-04-18 HISTORY — DX: Unspecified osteoarthritis, unspecified site: M19.90

## 2024-04-18 HISTORY — DX: Other specified health status: Z78.9

## 2024-04-18 HISTORY — DX: Prediabetes: R73.03

## 2024-04-18 LAB — BASIC METABOLIC PANEL WITH GFR
Anion gap: 7 (ref 5–15)
BUN: 17 mg/dL (ref 6–20)
CO2: 27 mmol/L (ref 22–32)
Calcium: 9.5 mg/dL (ref 8.9–10.3)
Chloride: 103 mmol/L (ref 98–111)
Creatinine, Ser: 0.57 mg/dL (ref 0.44–1.00)
GFR, Estimated: >60 mL/min (ref >60)
Glucose, Bld: 104 mg/dL — ABNORMAL HIGH (ref 70–99)
Potassium: 4 mmol/L (ref 3.5–5.1)
Sodium: 137 mmol/L (ref 135–145)

## 2024-04-18 LAB — CBC
HCT: 37.6 % (ref 36.0–46.0)
Hemoglobin: 11.7 g/dL — ABNORMAL LOW (ref 12.0–15.0)
MCH: 28.6 pg (ref 26.0–34.0)
MCHC: 31.1 g/dL (ref 30.0–36.0)
MCV: 91.9 fL (ref 80.0–100.0)
Platelets: 276 K/uL (ref 150–400)
RBC: 4.09 MIL/uL (ref 3.87–5.11)
RDW: 13.8 % (ref 11.5–15.5)
WBC: 5.4 K/uL (ref 4.0–10.5)
nRBC: 0 % (ref 0.0–0.2)

## 2024-04-30 ENCOUNTER — Ambulatory Visit (HOSPITAL_COMMUNITY): Admission: RE | Admit: 2024-04-30 | Source: Ambulatory Visit | Admitting: Urology

## 2024-04-30 ENCOUNTER — Encounter: Admission: RE | Payer: Self-pay | Source: Ambulatory Visit

## 2024-04-30 SURGERY — CYSTOSCOPY/URETEROSCOPY/HOLMIUM LASER/STENT PLACEMENT
Anesthesia: General | Laterality: Left

## 2024-05-06 DIAGNOSIS — N201 Calculus of ureter: Secondary | ICD-10-CM | POA: Diagnosis not present

## 2024-08-11 DIAGNOSIS — Z0000 Encounter for general adult medical examination without abnormal findings: Secondary | ICD-10-CM | POA: Diagnosis not present

## 2024-08-11 DIAGNOSIS — Z23 Encounter for immunization: Secondary | ICD-10-CM | POA: Diagnosis not present

## 2024-08-11 DIAGNOSIS — Z131 Encounter for screening for diabetes mellitus: Secondary | ICD-10-CM | POA: Diagnosis not present

## 2024-08-11 DIAGNOSIS — D649 Anemia, unspecified: Secondary | ICD-10-CM | POA: Diagnosis not present
